# Patient Record
Sex: Female | Born: 1965 | Race: Black or African American | Hispanic: No | Marital: Single | Smoking: Never smoker
Health system: Southern US, Community
[De-identification: ages and names within clinical notes are randomized; demographics above are authoritative.]

---

## 2011-05-27 ENCOUNTER — Ambulatory Visit (INDEPENDENT_AMBULATORY_CARE_PROVIDER_SITE_OTHER): Payer: BC Managed Care – HMO

## 2011-05-27 DIAGNOSIS — J9801 Acute bronchospasm: Secondary | ICD-10-CM

## 2011-05-27 DIAGNOSIS — R05 Cough: Secondary | ICD-10-CM

## 2011-08-04 ENCOUNTER — Encounter: Payer: Self-pay | Admitting: Family Medicine

## 2011-08-04 ENCOUNTER — Ambulatory Visit (INDEPENDENT_AMBULATORY_CARE_PROVIDER_SITE_OTHER): Payer: BC Managed Care – HMO | Admitting: Family Medicine

## 2011-08-04 VITALS — BP 121/71 | HR 92 | Temp 97.7°F | Resp 16 | Ht 65.5 in | Wt 158.2 lb

## 2011-08-04 DIAGNOSIS — Z0000 Encounter for general adult medical examination without abnormal findings: Secondary | ICD-10-CM

## 2011-08-04 LAB — HEMOCCULT GUIAC POC 1CARD (OFFICE): Fecal Occult Blood, POC: NEGATIVE

## 2011-08-04 LAB — POCT URINALYSIS DIPSTICK
Bilirubin, UA: NEGATIVE
Glucose, UA: NEGATIVE
Ketones, UA: NEGATIVE
Leukocytes, UA: NEGATIVE
Spec Grav, UA: 1.02

## 2011-08-04 NOTE — Patient Instructions (Signed)

## 2011-08-04 NOTE — Progress Notes (Signed)
Subjective:    Patient ID: Sara Fernandez, female    DOB: 04-01-1966, 46 y.o.   MRN: [id number]  HPI     This 46 y.o. African female, originally from [place], [place], is here for a physical exam  without labs or PAP. She is in good health with infrequent abdominal discomfort with some food  intolerances (fried foods and spicy foods). She denies having to take any OTC for this problem and has no  problem with voiding/ elimination.She denies BRBPR or melena.  HCM: per pt- mammogram done in Dec 2012    Review of Systems  AS per HPI; otherwise, negative      Objective:   Physical Exam  Nursing note and vitals reviewed. Constitutional: She is oriented to person, place, and time. She appears well-developed and well-nourished. No distress.  HENT:  Head: Normocephalic and atraumatic.  Right Ear: External ear normal.  Left Ear: External ear normal.  Nose: Nose normal.  Mouth/Throat: Oropharynx is clear and moist.       Vision:     With glasses    B-  20/15                                          L-  20/20                                          R-  20/20  Eyes: Conjunctivae and EOM are normal. Pupils are equal, round, and reactive to light. Right eye exhibits no discharge. Left eye exhibits no discharge. No scleral icterus.  Neck: Normal range of motion. Neck supple. No thyromegaly present.  Cardiovascular: Normal rate, regular rhythm, normal heart sounds and intact distal pulses.  Exam reveals no gallop and no friction rub.   No murmur heard. Pulmonary/Chest: Effort normal and breath sounds normal. No respiratory distress. She has no wheezes.  Abdominal: Soft. Bowel sounds are normal. She exhibits no mass. There is no tenderness. There is no guarding.  Musculoskeletal: Normal range of motion. She exhibits no edema.  Lymphadenopathy:    She has no cervical adenopathy.  Neurological: She is alert and oriented to person, place, and time. She has normal reflexes. No cranial nerve  deficit. Coordination normal.  Skin: Skin is warm and dry.  Psychiatric: She has a normal mood and affect. Her behavior is normal.    Results for orders placed in visit on 08/04/11  POCT URINALYSIS DIPSTICK      Component Value Range   Color, UA yellow     Clarity, UA clear     Glucose, UA neg     Bilirubin, UA neg     Ketones, UA neg     Spec Grav, UA 1.020     Blood, UA trace     pH, UA 7.0     Protein, UA neg     Urobilinogen, UA 0.2     Nitrite, UA neg     Leukocytes, UA Negative    HEMOCCULT GUIAC POC 1CARD (OFFICE)      Component Value Range   Fecal Occult Blood, POC Negative     Card #1 Date       Card #2 Fecal Occult Blod, POC       Card #2 Date  Card #3 Fecal Occult Blood, POC       Card #3 Date        Labs 07/25/2010: CBC- Normal                               CMET- Normal                               Lipids- Normal                               TSH- Normal      Assessment & Plan:   1. Routine general medical examination at a health care facility  POCT urinalysis dipstick, POCT Occult Blood Stool Advised pt to sch PAP/pelvic exam within the next 6 months.

## 2011-12-21 ENCOUNTER — Ambulatory Visit (INDEPENDENT_AMBULATORY_CARE_PROVIDER_SITE_OTHER): Payer: BC Managed Care – HMO | Admitting: Family Medicine

## 2011-12-21 VITALS — BP 120/72 | HR 75 | Temp 98.3°F | Resp 16 | Ht 65.0 in | Wt 158.8 lb

## 2011-12-21 DIAGNOSIS — R062 Wheezing: Secondary | ICD-10-CM

## 2011-12-21 DIAGNOSIS — J45909 Unspecified asthma, uncomplicated: Secondary | ICD-10-CM

## 2011-12-21 MED ORDER — ALBUTEROL SULFATE (2.5 MG/3ML) 0.083% IN NEBU
2.5000 mg | INHALATION_SOLUTION | Freq: Once | RESPIRATORY_TRACT | Status: AC
  Administered 2011-12-21: 2.5 mg via RESPIRATORY_TRACT

## 2011-12-21 MED ORDER — ALBUTEROL SULFATE HFA 108 (90 BASE) MCG/ACT IN AERS: 2.0000 | INHALATION_SPRAY | Freq: Four times a day (QID) | RESPIRATORY_TRACT | Status: DC | PRN

## 2011-12-21 MED ORDER — METHYLPREDNISOLONE 4 MG PO KIT: PACK | ORAL | Status: AC

## 2011-12-21 MED ORDER — METHYLPREDNISOLONE SODIUM SUCC 125 MG IJ SOLR
125.0000 mg | Freq: Once | INTRAMUSCULAR | Status: AC
  Administered 2011-12-21: 125 mg via INTRAMUSCULAR

## 2011-12-21 MED ORDER — FLUTICASONE-SALMETEROL 100-50 MCG/DOSE IN AEPB: 1.0000 | INHALATION_SPRAY | Freq: Two times a day (BID) | RESPIRATORY_TRACT | Status: DC

## 2011-12-21 MED ORDER — IPRATROPIUM BROMIDE 0.02 % IN SOLN
0.5000 mg | Freq: Once | RESPIRATORY_TRACT | Status: AC
  Administered 2011-12-21: 0.5 mg via RESPIRATORY_TRACT

## 2011-12-21 NOTE — Progress Notes (Signed)
[facility]:  Office Visit  Chief Complaint:  Chief Complaint  Patient presents with  . Asthma    W/SOB  X 3 DAYS    HPI: Sara Fernandez is a 46 y.o. female who complains of  SOB, Wheezing x 3 days. She has a h/o asthma and ahas been using her inhaler more frequently, usually does not use but  Now using 3 x daily without improvement. 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 No cough, chest pain, URI sxs. Nonsmoker.   Was previosuly rx Advair for exacerbation which seems to help. She gets asthma attacks like this 2 times a year. Does not normally need  advair or albuterol inh.   Past Medical History  Diagnosis Date  . Asthma    History reviewed. No pertinent past surgical history. History   Social History  . Marital Status: Single    Spouse Name: N/A    Number of Children: N/A  . Years of Education: N/A   Social History Main Topics  . Smoking status: Never Smoker   . Smokeless tobacco: None  . Alcohol Use: No  . Drug Use: No  . Sexually Active: None   Other Topics Concern  . None   Social History Narrative  . None   No family history on file. Not on File Prior to Admission medications   Medication Sig Start Date End Date Taking? Authorizing Provider  acetaminophen (TYLENOL) 500 MG tablet Take 500 mg by mouth as needed.   Yes Historical Provider, MD  albuterol (PROVENTIL HFA;VENTOLIN HFA) 108 (90 BASE) MCG/ACT inhaler Inhale 2 puffs into the lungs every 6 (six) hours as needed.   Yes Historical Provider, MD  albuterol (PROVENTIL) (2.5 MG/3ML) 0.083% nebulizer solution Take 2.5 mg by nebulization every 6 (six) hours as needed.   Yes Historical Provider, MD  Multiple Vitamin (MULTIVITAMIN) tablet Take 1 tablet by mouth daily.   Yes Historical Provider, MD  NON FORMULARY    Yes Historical Provider, MD     ROS: The patient denies fevers, chills, night sweats,  unintentional weight loss, chest pain, palpitations, dyspnea on exertion, nausea, vomiting, abdominal pain, dysuria, hematuria, melena, numbness, weakness, or tingling. + wheeze, SOB  All other systems have been reviewed and were otherwise negative with the exception of those mentioned in the HPI and as above.    PHYSICAL EXAM: Filed Vitals:   12/21/11 1008  BP: 120/72  Pulse: 75  Temp: 98.3 F (36.8 C)  Resp: 16  Spo2                  100% Filed Vitals:   12/21/11 1008  Height: 5\' 5"  (1.651 m)  Weight: 158 lb 12.8 oz (72.031 kg)   Body mass index is 26.43 kg/(m^2).  General: Alert, no acute distress HEENT:  Normocephalic, atraumatic, oropharynx patent.  Cardiovascular:  Regular rate and rhythm, no rubs murmurs or gallops.  No Carotid bruits, radial pulse intact. No pedal edema.  Respiratory: Clear to auscultation bilaterally.  +wheezes, tightness; no rales, or rhonchi.  No cyanosis, no use of accessory musculature.  GI: No organomegaly, abdomen is soft and non-tender, positive bowel sounds.  No masses. Skin: No rashes. Neurologic: Facial musculature symmetric. Psychiatric: Patient is appropriate throughout our interaction. Lymphatic: No cervical lymphadenopathy Musculoskeletal: Gait intact.   LABS:    EKG/XRAY:   Primary read interpreted  by Dr. Conley Rolls at [facility].   ASSESSMENT/PLAN: Encounter Diagnoses  Name Primary?  . Wheezing Yes  . Asthma    Asthmatic exacerbation secondary to chemical/hot air exposure at work. There is a language barrier. She received Solumedrol and duonebs in office.Patient feeling better. She has decreased wheezes after neb treatment and IM steroid injection Rx: Advair 100/50 inh, albuterol INH and medrol dose pack. There is a language barrier but patient's son is with her. Advise to return/go to ER if sxs do not improve/worsen. Will defer xray for now.      Marsia Cino PHUONG, DO 12/21/2011 10:57 AM

## 2012-11-08 ENCOUNTER — Ambulatory Visit: Payer: BC Managed Care – HMO

## 2012-11-08 ENCOUNTER — Ambulatory Visit (INDEPENDENT_AMBULATORY_CARE_PROVIDER_SITE_OTHER): Payer: BC Managed Care – HMO | Admitting: Family Medicine

## 2012-11-08 VITALS — BP 126/85 | HR 70 | Temp 98.3°F | Resp 18 | Wt 157.0 lb

## 2012-11-08 DIAGNOSIS — R319 Hematuria, unspecified: Secondary | ICD-10-CM

## 2012-11-08 DIAGNOSIS — R079 Chest pain, unspecified: Secondary | ICD-10-CM

## 2012-11-08 DIAGNOSIS — R062 Wheezing: Secondary | ICD-10-CM

## 2012-11-08 DIAGNOSIS — R0789 Other chest pain: Secondary | ICD-10-CM

## 2012-11-08 DIAGNOSIS — J45909 Unspecified asthma, uncomplicated: Secondary | ICD-10-CM

## 2012-11-08 LAB — POCT URINALYSIS DIPSTICK
Bilirubin, UA: NEGATIVE
Glucose, UA: NEGATIVE
Ketones, UA: NEGATIVE
Nitrite, UA: NEGATIVE
Protein, UA: NEGATIVE
Spec Grav, UA: 1.02
Urobilinogen, UA: 0.2
pH, UA: 7

## 2012-11-08 LAB — POCT UA - MICROSCOPIC ONLY
Casts, Ur, LPF, POC: NEGATIVE
Crystals, Ur, HPF, POC: NEGATIVE
Mucus, UA: NEGATIVE
Yeast, UA: NEGATIVE

## 2012-11-08 MED ORDER — ALBUTEROL SULFATE HFA 108 (90 BASE) MCG/ACT IN AERS: 2.0000 | INHALATION_SPRAY | Freq: Four times a day (QID) | RESPIRATORY_TRACT | Status: DC | PRN

## 2012-11-08 MED ORDER — ALBUTEROL SULFATE (2.5 MG/3ML) 0.083% IN NEBU
2.5000 mg | INHALATION_SOLUTION | Freq: Once | RESPIRATORY_TRACT | Status: AC
  Administered 2012-11-08: 2.5 mg via RESPIRATORY_TRACT

## 2012-11-08 MED ORDER — ALBUTEROL SULFATE (2.5 MG/3ML) 0.083% IN NEBU: 2.5000 mg | INHALATION_SOLUTION | Freq: Four times a day (QID) | RESPIRATORY_TRACT | Status: DC | PRN

## 2012-11-08 MED ORDER — PREDNISONE 20 MG PO TABS: ORAL_TABLET | ORAL | Status: DC

## 2012-11-08 MED ORDER — FLUTICASONE-SALMETEROL 100-50 MCG/DOSE IN AEPB: 1.0000 | INHALATION_SPRAY | Freq: Two times a day (BID) | RESPIRATORY_TRACT | Status: DC

## 2012-11-08 NOTE — Progress Notes (Addendum)
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  She is out of her medications.   H/O asthma Nonsmoker No fever Some left flank pain Also notes sinus congestion without pain.  Objective:  Mild resp distress.  Color good Chest: decreased breath sounds with bilateral insp and exp wheezes  Given albuterol neb here Heart: reg, no murmur HEENT: unremarkable Skin: no eczema Ext:  No edema Results for orders placed in visit on 11/08/12  POCT UA - MICROSCOPIC ONLY      Result Value Range   WBC, Ur, HPF, POC 1-3     RBC, urine, microscopic 25-30     Bacteria, U Microscopic trace     Mucus, UA neg     Epithelial cells, urine per micros 1-2     Crystals, Ur, HPF, POC neg     Casts, Ur, LPF, POC neg     Yeast, UA neg    POCT URINALYSIS DIPSTICK      Result Value Range   Color, UA yellow     Clarity, UA cloudy     Glucose, UA neg     Bilirubin, UA neg     Ketones, UA neg     Spec Grav, UA 1.020     Blood, UA large     pH, UA 7.0     Protein, UA neg     Urobilinogen, UA 0.2     Nitrite, UA neg     Leukocytes, UA Trace    [facility] reading (PRIMARY) by  Dr. Milus Glazier:  Negative chest.    Assessment:  Seasonal asthma/allergy with acute exacerbation and no medication at home. The hematuria and left flank pain are ominous. He will need specialist attention to  Plan:  Wheezing - Plan: albuterol (PROVENTIL HFA;VENTOLIN HFA) 108 (90 BASE) MCG/ACT inhaler, albuterol (PROVENTIL) (2.5 MG/3ML) 0.083% nebulizer solution, Fluticasone-Salmeterol (ADVAIR DISKUS) 100-50 MCG/DOSE AEPB  Asthma - Plan: albuterol (PROVENTIL HFA;VENTOLIN HFA) 108 (90 BASE) MCG/ACT inhaler, albuterol (PROVENTIL) (2.5 MG/3ML) 0.083% nebulizer solution, Fluticasone-Salmeterol (ADVAIR DISKUS) 100-50 MCG/DOSE AEPB, predniSONE (DELTASONE) 20 MG tablet  Signed, Elvina Sidle, MD  Call cell [phone]

## 2013-02-05 ENCOUNTER — Ambulatory Visit (INDEPENDENT_AMBULATORY_CARE_PROVIDER_SITE_OTHER): Payer: BC Managed Care – HMO | Admitting: Physician Assistant

## 2013-02-05 ENCOUNTER — Encounter: Payer: Self-pay | Admitting: Physician Assistant

## 2013-02-05 VITALS — BP 116/72 | HR 72 | Temp 98.7°F | Resp 16 | Ht 65.5 in | Wt 157.0 lb

## 2013-02-05 DIAGNOSIS — J45909 Unspecified asthma, uncomplicated: Secondary | ICD-10-CM | POA: Insufficient documentation

## 2013-02-05 DIAGNOSIS — R062 Wheezing: Secondary | ICD-10-CM

## 2013-02-05 MED ORDER — ALBUTEROL SULFATE HFA 108 (90 BASE) MCG/ACT IN AERS: 2.0000 | INHALATION_SPRAY | Freq: Four times a day (QID) | RESPIRATORY_TRACT | Status: DC | PRN

## 2013-02-05 MED ORDER — ALBUTEROL SULFATE (2.5 MG/3ML) 0.083% IN NEBU: 2.5000 mg | INHALATION_SOLUTION | Freq: Four times a day (QID) | RESPIRATORY_TRACT | Status: DC | PRN

## 2013-02-05 MED ORDER — FLUTICASONE-SALMETEROL 100-50 MCG/DOSE IN AEPB: 1.0000 | INHALATION_SPRAY | Freq: Two times a day (BID) | RESPIRATORY_TRACT | Status: DC

## 2013-02-05 MED ORDER — ALBUTEROL SULFATE (2.5 MG/3ML) 0.083% IN NEBU: 2.5000 mg | INHALATION_SOLUTION | Freq: Once | RESPIRATORY_TRACT | Status: DC

## 2013-02-05 NOTE — Progress Notes (Signed)
Patient ID: Sara Fernandez MRN: [id number], DOB: Nov 12, 1965, 47 y.o. Date of Encounter: 02/05/2013, 11:10 AM  Primary Physician: No PCP Per Patient  Chief Complaint: Asthma follow up  HPI: 48 y.o. female with history below presents for asthma follow up. She ran out of her albuterol inhaler one week ago as well as her Advair Diskus 2 weeks ago. She does have her albuterol nebs and nebulizer machine at home, but has not used them. She complains of waxing and waning wheezing and SOB. These symptoms are worse at nighttime. When she does have her medications she is well controlled and does not require the usage of her albuterol inhaler frequently. She has not been to the ED recently for her asthma. Afebrile. No chills.   She currently feels like she is breathing ok, just a little wheezing. No SOB. No chest pain or chest tightness.    Past Medical History  Diagnosis Date  . Asthma      Home Meds: Prior to Admission medications   Medication Sig Start Date End Date Taking? Authorizing Provider  albuterol (PROVENTIL HFA;VENTOLIN HFA) 108 (90 BASE) MCG/ACT inhaler Inhale 2 puffs into the lungs every 6 (six) hours as needed for wheezing or shortness of breath. 11/08/12  No Elvina Sidle, MD  albuterol (PROVENTIL) (2.5 MG/3ML) 0.083% nebulizer solution Take 3 mLs (2.5 mg total) by nebulization every 6 (six) hours as needed. 11/08/12  No Elvina Sidle, MD  Fluticasone-Salmeterol (ADVAIR DISKUS) 100-50 MCG/DOSE AEPB Inhale 1 puff into the lungs 2 (two) times daily. 11/08/12  No Elvina Sidle, MD  Multiple Vitamin (MULTIVITAMIN) tablet Take 1 tablet by mouth daily.   No Historical Provider, MD  NON FORMULARY     Historical Provider, MD           Allergies: No Known Allergies  History   Social History  . Marital Status: Single    Spouse Name: N/A    Number of Children: N/A  . Years of Education: N/A   Occupational History  . Not on file.   Social History Main Topics  . Smoking status:  Never Smoker   . Smokeless tobacco: Not on file  . Alcohol Use: No  . Drug Use: No  . Sexual Activity: Yes   Other Topics Concern  . Not on file   Social History Narrative  . No narrative on file     Review of Systems: Constitutional: negative for chills, fever, or fatigue  HEENT: negative for vision changes, hearing loss, congestion, rhinorrhea, ST, epistaxis, or sinus pressure Cardiovascular: negative for chest pain or palpitations Respiratory: see above Abdominal: negative for abdominal pain, nausea, vomiting, or diarrhea Dermatological: negative for rash Neurologic: negative for headache, dizziness, or syncope   Physical Exam: Blood pressure 116/72, pulse 72, temperature 98.7 F (37.1 C), temperature source Oral, resp. rate 16, height 5' 5.5" (1.664 m), weight 157 lb (71.215 kg), last menstrual period 01/26/2013, SpO2 100.00%., Body mass index is 25.72 kg/(m^2). General: Well developed, well nourished, in no acute distress. Head: Normocephalic, atraumatic, eyes without discharge, sclera non-icteric, nares are without discharge. Bilateral auditory canals clear, TM's are without perforation, pearly grey and translucent with reflective cone of light bilaterally. Oral cavity moist, posterior pharynx without exudate, erythema, peritonsillar abscess, or post nasal drip. Uvula midline.   Neck: Supple. No thyromegaly. Full ROM. No lymphadenopathy. Lungs: Mild expiratory wheezing bilaterally. No rales or rhonchi. Breathing is unlabored. Status post albuterol nebulizer lungs are CTAB.  Heart: RRR with S1 S2. No murmurs, rubs,  or gallops appreciated. Msk:  Strength and tone normal for age. Extremities/Skin: Warm and dry. No clubbing or cyanosis. No edema. No rashes or suspicious lesions. Neuro: Alert and oriented X 3. Moves all extremities spontaneously. Gait is normal. CNII-XII grossly in tact. Psych:  Responds to questions appropriately with a normal affect.     ASSESSMENT AND PLAN:    47 y.o. female with uncontrolled asthma and language barrier.  -Albuterol nebulizer in office -Refilled Proventil 2 puffs inhaled q 4-6 hours prn #1 RF 2 -Refilled Albuterol nebs q 6 hours prn #1 box RF 6  -Refilled Advaird Diskus 100/50 1 puff bid #1 RF 11 -Do not run out of medications -Husband here to help interpret   Signed, Eula Listen, PA-C 02/05/2013 11:10 AM

## 2014-03-11 ENCOUNTER — Other Ambulatory Visit: Payer: Self-pay | Admitting: Physician Assistant

## 2014-03-14 ENCOUNTER — Other Ambulatory Visit: Payer: Self-pay | Admitting: Physician Assistant

## 2014-03-16 ENCOUNTER — Ambulatory Visit (INDEPENDENT_AMBULATORY_CARE_PROVIDER_SITE_OTHER): Payer: BC Managed Care – HMO

## 2014-03-16 ENCOUNTER — Encounter: Payer: Self-pay | Admitting: Family Medicine

## 2014-03-16 ENCOUNTER — Ambulatory Visit (INDEPENDENT_AMBULATORY_CARE_PROVIDER_SITE_OTHER): Payer: BC Managed Care – HMO | Admitting: Family Medicine

## 2014-03-16 ENCOUNTER — Ambulatory Visit: Payer: BC Managed Care – HMO

## 2014-03-16 DIAGNOSIS — J4531 Mild persistent asthma with (acute) exacerbation: Secondary | ICD-10-CM

## 2014-03-16 DIAGNOSIS — R0602 Shortness of breath: Secondary | ICD-10-CM

## 2014-03-16 DIAGNOSIS — R062 Wheezing: Secondary | ICD-10-CM

## 2014-03-16 DIAGNOSIS — J45901 Unspecified asthma with (acute) exacerbation: Secondary | ICD-10-CM

## 2014-03-16 LAB — POCT CBC
GRANULOCYTE PERCENT: 66.4 % (ref 37–80)
HCT, POC: 38.5 % (ref 37.7–47.9)
Hemoglobin: 12.2 g/dL (ref 12.2–16.2)
LYMPH, POC: 1.9 (ref 0.6–3.4)
MCH, POC: 24.9 pg — AB (ref 27–31.2)
MCHC: 31.7 g/dL — AB (ref 31.8–35.4)
MCV: 78.6 fL — AB (ref 80–97)
MID (CBC): 0.7 (ref 0–0.9)
MPV: 8.2 fL (ref 0–99.8)
PLATELET COUNT, POC: 325 10*3/uL (ref 142–424)
POC GRANULOCYTE: 5.2 (ref 2–6.9)
POC LYMPH %: 24.4 % (ref 10–50)
POC MID %: 9.2 % (ref 0–12)
RBC: 4.9 M/uL (ref 4.04–5.48)
RDW, POC: 16.3 %
WBC: 7.8 10*3/uL (ref 4.6–10.2)

## 2014-03-16 LAB — POCT URINE PREGNANCY: PREG TEST UR: NEGATIVE

## 2014-03-16 MED ORDER — ALBUTEROL SULFATE (2.5 MG/3ML) 0.083% IN NEBU
2.5000 mg | INHALATION_SOLUTION | Freq: Once | RESPIRATORY_TRACT | Status: AC
  Administered 2014-03-16: 2.5 mg via RESPIRATORY_TRACT

## 2014-03-16 MED ORDER — FLUTICASONE-SALMETEROL 100-50 MCG/DOSE IN AEPB: 1.0000 | INHALATION_SPRAY | Freq: Two times a day (BID) | RESPIRATORY_TRACT | Status: AC

## 2014-03-16 MED ORDER — ALBUTEROL SULFATE (2.5 MG/3ML) 0.083% IN NEBU: 2.5000 mg | INHALATION_SOLUTION | Freq: Four times a day (QID) | RESPIRATORY_TRACT | Status: AC | PRN

## 2014-03-16 MED ORDER — IPRATROPIUM BROMIDE 0.02 % IN SOLN
0.5000 mg | Freq: Once | RESPIRATORY_TRACT | Status: AC
  Administered 2014-03-16: 0.5 mg via RESPIRATORY_TRACT

## 2014-03-16 MED ORDER — PREDNISONE 20 MG PO TABS: ORAL_TABLET | ORAL | Status: AC

## 2014-03-16 NOTE — Patient Instructions (Signed)
Come back in to have your breathing checked in 2 weeks- come in sooner if you are not better in 2 days or if you are worse.  Restart Advair immediately.

## 2014-03-16 NOTE — Progress Notes (Signed)
Subjective:    Patient ID: Sara Fernandez, female    DOB: 08-26-1965, 48 y.o.   MRN: [id number]  HPI This is a pleasant 48 yo female who is brought in today by her nephew who is helping with translation. The patient presents this morning with cough, wheezing and SOB for 3 days. She was previously on Advair, but has been out for about 1 week. She has been using her albuterol nebulizer with some relief. Used inhaler 3x yesterday, helped for about 10 minutes. She has some upper back pain that started yesterday with coughing. No OTC medications.   Was seen twice last year for similar symptoms.   Review of Systems No fever or chills, no ear pain, no nasal drainage, no chest pain, some head ache.     Objective:   Physical Exam  Constitutional: She is oriented to person, place, and time. She appears well-developed and well-nourished.  HENT:  Head: Normocephalic and atraumatic.  Right Ear: External ear normal.  Left Ear: External ear normal.  Nose: Nose normal.  Mouth/Throat: Oropharynx is clear and moist.  Eyes: Conjunctivae are normal. Pupils are equal, round, and reactive to light.  Neck: Normal range of motion. Neck supple.  Cardiovascular: Normal rate, regular rhythm and normal heart sounds.   Pulmonary/Chest: Effort normal. No respiratory distress. She has no decreased breath sounds. She has wheezes (Expiratory wheezes throughtout posteriorly.). She has rhonchi in the right lower field and the left lower field. She has no rales.  Pulse ox- 97-98 upon arrival. RR 20.   Musculoskeletal: Normal range of motion.  Neurological: She is alert and oriented to person, place, and time.  Skin: Skin is warm and dry.  Psychiatric: She has a normal mood and affect. Her behavior is normal. Judgment and thought content normal.   [facility] reading (PRIMARY) by  Dr. Alwyn Ren- hyperaeration and prominent markings.   Results for orders placed in visit on 03/16/14  POCT CBC      Result Value Ref Range   WBC  7.8  4.6 - 10.2 K/uL   Lymph, poc 1.9  0.6 - 3.4   POC LYMPH PERCENT 24.4  10 - 50 %L   MID (cbc) 0.7  0 - 0.9   POC MID % 9.2  0 - 12 %M   POC Granulocyte 5.2  2 - 6.9   Granulocyte percent 66.4  37 - 80 %G   RBC 4.90  4.04 - 5.48 M/uL   Hemoglobin 12.2  12.2 - 16.2 g/dL   HCT, POC 40.9  81.1 - 47.9 %   MCV 78.6 (*) 80 - 97 fL   MCH, POC 24.9 (*) 27 - 31.2 pg   MCHC 31.7 (*) 31.8 - 35.4 g/dL   RDW, POC 91.4     Platelet Count, POC 325  142 - 424 K/uL   MPV 8.2  0 - 99.8 fL  POCT URINE PREGNANCY      Result Value Ref Range   Preg Test, Ur Negative     Patient given combination nebulizer with loosening of cough, decreased wheezing and subjective improvement.  PF after neb- 200- patient did not have good effort. Predicted 470. Patient able to talk and ambulate without increased RR or SOB.    Assessment & Plan:  Discussed with Dr. Alwyn Ren who read Xray 1. Shortness of breath - albuterol (PROVENTIL) (2.5 MG/3ML) 0.083% nebulizer solution 2.5 mg; Take 3 mLs (2.5 mg total) by nebulization once. - ipratropium (ATROVENT) nebulizer solution 0.5 mg; Take  2.5 mLs (0.5 mg total) by nebulization once. - DG Chest 2 View; Future - POCT CBC - POCT urine pregnancy  2. Wheezing - albuterol (PROVENTIL) (2.5 MG/3ML) 0.083% nebulizer solution; Take 3 mLs (2.5 mg total) by nebulization every 6 (six) hours as needed.  Dispense: 75 mL; Refill: 6 - Fluticasone-Salmeterol (ADVAIR DISKUS) 100-50 MCG/DOSE AEPB; Inhale 1 puff into the lungs 2 (two) times daily.  Dispense: 60 each; Refill: 11  3. Asthma, mild persistent, with acute exacerbation - albuterol (PROVENTIL) (2.5 MG/3ML) 0.083% nebulizer solution; Take 3 mLs (2.5 mg total) by nebulization every 6 (six) hours as needed.  Dispense: 75 mL; Refill: 6 - predniSONE (DELTASONE) 20 MG tablet; Take 3 tablets for 3 days, then 2 tablets for 3 days, then 1 tablet for 3 day  Dispense: 18 tablet; Refill: 0  4. Asthma, unspecified asthma severity, with acute  exacerbation - Fluticasone-Salmeterol (ADVAIR DISKUS) 100-50 MCG/DOSE AEPB; Inhale 1 puff into the lungs 2 (two) times daily.  Dispense: 60 each; Refill: 11  -Provided written and verbal information regarding diagnosis and treatment. -Stressed importance of patient resuming Advair and remaining on it long term -RTC in 2 weeks for recheck of peak flow/asthma status, sooner if worsening SOB/cough.  Emi Belfast, FNP-BC  [facility], [facility]  03/16/2014 7:51 PM

## 2016-02-09 IMAGING — CR DG CHEST 2V
2 series · 2 of 2 positions shown · non-contrast
Comparison: PA and lateral chest of November 08, 2012

CLINICAL DATA: Shortness of breath.

EXAM:
CHEST  2 VIEW

[PA]
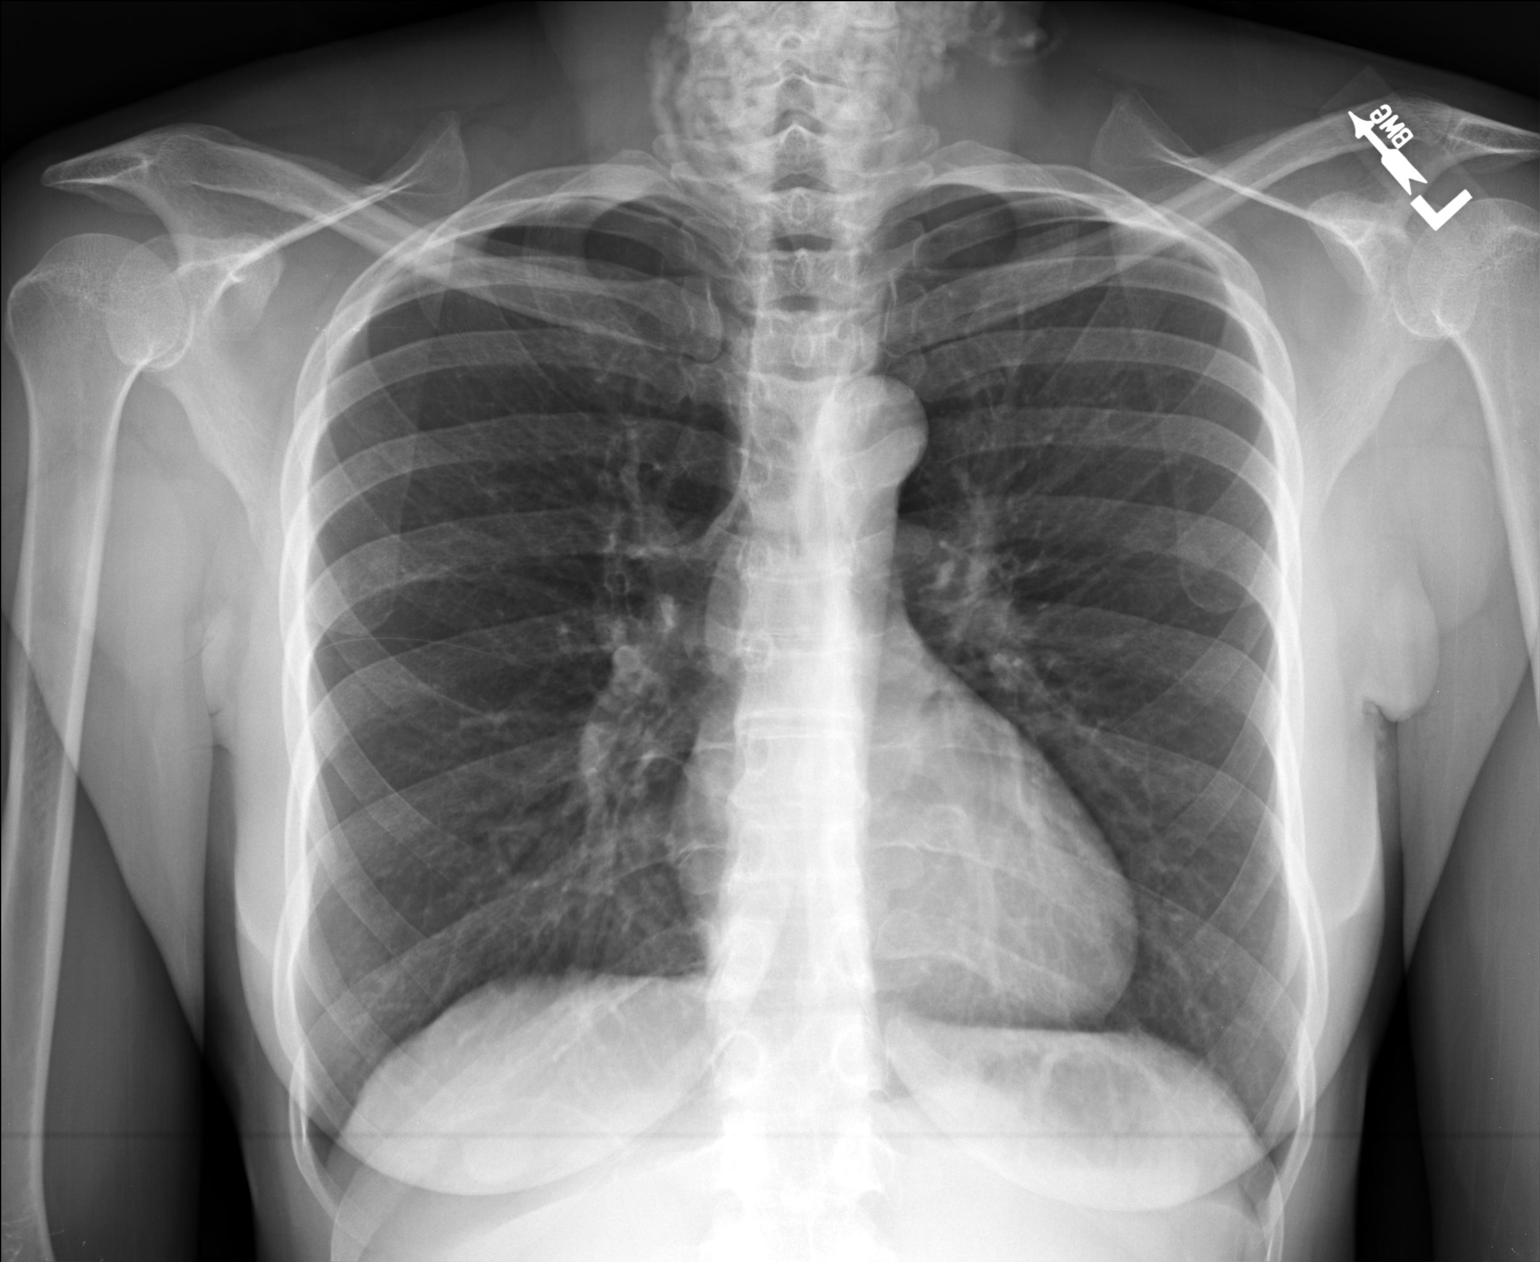

[lateral]
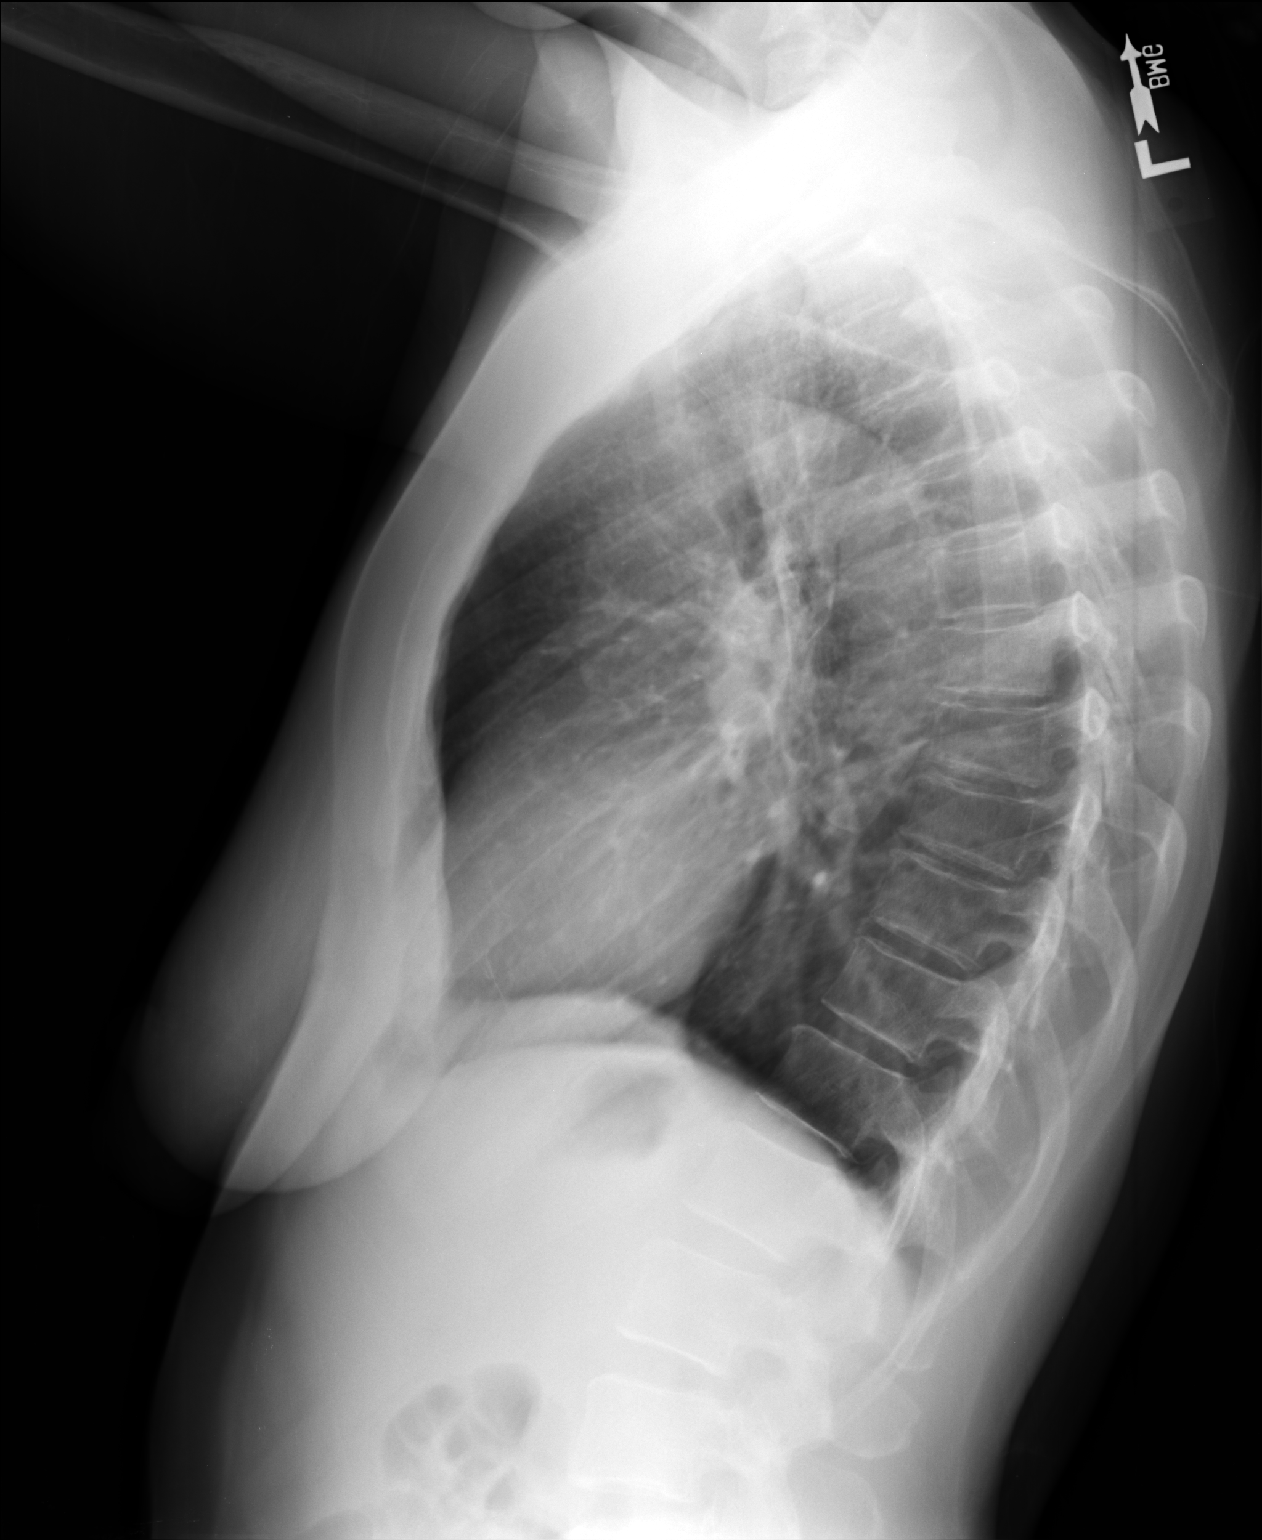

[2 of 2 positions shown; findings below may reference images not displayed]

FINDINGS: The lungs are mildly hyperinflated. There is no focal infiltrate.
The heart and pulmonary vascularity are within the limits of normal.
There is no pleural effusion or pneumothorax. The trachea is
midline. The observed portions of the bony thorax are unremarkable.
IMPRESSION: Mild hyperinflation is consistent with COPD or reactive airway
disease. There is no evidence of pneumonia nor CHF nor other acute
cardiopulmonary abnormality.
